# Patient Record
Sex: Female | Born: 1980 | Race: White | Hispanic: No | Marital: Married | Smoking: Former smoker
Health system: Southern US, Community
[De-identification: ages and names within clinical notes are randomized; demographics above are authoritative.]

## PROBLEM LIST (undated history)

## (undated) DIAGNOSIS — J45909 Unspecified asthma, uncomplicated: Secondary | ICD-10-CM

## (undated) DIAGNOSIS — I499 Cardiac arrhythmia, unspecified: Secondary | ICD-10-CM

## (undated) DIAGNOSIS — F419 Anxiety disorder, unspecified: Secondary | ICD-10-CM

## (undated) HISTORY — PX: OTHER SURGICAL HISTORY: SHX169

## (undated) HISTORY — PX: TONSILLECTOMY AND ADENOIDECTOMY: SHX28

---

## 2004-09-10 ENCOUNTER — Ambulatory Visit: Payer: Self-pay

## 2007-04-10 ENCOUNTER — Ambulatory Visit: Payer: Self-pay | Admitting: General Surgery

## 2007-04-17 ENCOUNTER — Ambulatory Visit: Payer: Self-pay | Admitting: General Surgery

## 2009-09-16 ENCOUNTER — Emergency Department: Payer: Self-pay | Admitting: Emergency Medicine

## 2009-11-09 ENCOUNTER — Ambulatory Visit: Payer: Self-pay | Admitting: Orthopedic Surgery

## 2009-11-14 ENCOUNTER — Ambulatory Visit: Payer: Self-pay | Admitting: Orthopedic Surgery

## 2012-07-21 ENCOUNTER — Observation Stay: Payer: Self-pay | Admitting: Obstetrics and Gynecology

## 2012-08-26 ENCOUNTER — Observation Stay: Payer: Self-pay | Admitting: Obstetrics and Gynecology

## 2012-08-26 LAB — URINALYSIS, COMPLETE
Bilirubin,UR: NEGATIVE
Blood: NEGATIVE
Nitrite: NEGATIVE
Ph: 7 (ref 4.5–8.0)
Protein: 30
RBC,UR: 9 /HPF (ref 0–5)
Squamous Epithelial: 64
WBC UR: 12 /HPF (ref 0–5)

## 2012-10-16 ENCOUNTER — Inpatient Hospital Stay: Payer: Self-pay

## 2012-10-16 LAB — PIH PROFILE
Anion Gap: 12 (ref 7–16)
BUN: 9 mg/dL (ref 7–18)
Calcium, Total: 8.8 mg/dL (ref 8.5–10.1)
Creatinine: 0.68 mg/dL (ref 0.60–1.30)
EGFR (Non-African Amer.): >60
HCT: 39.7 % (ref 35.0–47.0)
HGB: 13 g/dL (ref 12.0–16.0)
MCHC: 32.7 g/dL (ref 32.0–36.0)
MCV: 87 fL (ref 80–100)
Osmolality: 282 (ref 275–301)
Platelet: 129 10*3/uL — ABNORMAL LOW (ref 150–440)
Potassium: 3.6 mmol/L (ref 3.5–5.1)
RBC: 4.58 10*6/uL (ref 3.80–5.20)
RDW: 15.7 % — ABNORMAL HIGH (ref 11.5–14.5)
SGOT(AST): 17 U/L (ref 15–37)
Sodium: 141 mmol/L (ref 136–145)

## 2012-10-16 LAB — PROTEIN / CREATININE RATIO, URINE
Creatinine, Urine: 195.4 mg/dL — ABNORMAL HIGH (ref 30.0–125.0)
Protein/Creat. Ratio: 189 mg/gCREAT (ref 0–200)

## 2012-10-17 LAB — PLATELET COUNT: Platelet: 128 10*3/uL — ABNORMAL LOW (ref 150–440)

## 2012-10-18 LAB — PIH PROFILE
BUN: 5 mg/dL — ABNORMAL LOW (ref 7–18)
Calcium, Total: 7 mg/dL — CL (ref 8.5–10.1)
Chloride: 106 mmol/L (ref 98–107)
Creatinine: 0.55 mg/dL — ABNORMAL LOW (ref 0.60–1.30)
EGFR (Non-African Amer.): >60
Glucose: 109 mg/dL — ABNORMAL HIGH (ref 65–99)
MCHC: 33.3 g/dL (ref 32.0–36.0)
Potassium: 4.1 mmol/L (ref 3.5–5.1)
RBC: 4.01 10*6/uL (ref 3.80–5.20)
RDW: 15.8 % — ABNORMAL HIGH (ref 11.5–14.5)
SGOT(AST): 17 U/L (ref 15–37)
Sodium: 136 mmol/L (ref 136–145)

## 2012-10-18 LAB — MAGNESIUM: Magnesium: 5 mg/dL — ABNORMAL HIGH

## 2012-10-19 LAB — PIH PROFILE
BUN: 6 mg/dL — ABNORMAL LOW (ref 7–18)
Calcium, Total: 8.1 mg/dL — ABNORMAL LOW (ref 8.5–10.1)
Chloride: 107 mmol/L (ref 98–107)
Co2: 25 mmol/L (ref 21–32)
Glucose: 76 mg/dL (ref 65–99)
HCT: 30.2 % — ABNORMAL LOW (ref 35.0–47.0)
HGB: 10.1 g/dL — ABNORMAL LOW (ref 12.0–16.0)
MCHC: 33.4 g/dL (ref 32.0–36.0)
MCV: 89 fL (ref 80–100)
Platelet: 113 10*3/uL — ABNORMAL LOW (ref 150–440)
Potassium: 4.1 mmol/L (ref 3.5–5.1)
RDW: 16.3 % — ABNORMAL HIGH (ref 11.5–14.5)
SGOT(AST): 20 U/L (ref 15–37)
Uric Acid: 6.1 mg/dL — ABNORMAL HIGH (ref 2.6–6.0)

## 2013-03-03 ENCOUNTER — Ambulatory Visit: Payer: Self-pay | Admitting: Unknown Physician Specialty

## 2013-03-05 LAB — PATHOLOGY REPORT

## 2013-07-06 ENCOUNTER — Ambulatory Visit: Payer: Self-pay | Admitting: Podiatry

## 2014-06-06 ENCOUNTER — Ambulatory Visit: Payer: Self-pay | Admitting: Family Medicine

## 2015-01-06 NOTE — Op Note (Signed)
PATIENT NAME:  Theresa Hensley, TOELLE MR#:  [id number] DATE OF BIRTH:  [birth year]  DATE OF PROCEDURE:  10/18/2012  PREOPERATIVE DIAGNOSIS: Active phase arrest.   POSTOPERATIVE DIAGNOSIS: Active phase arrest.   PROCEDURE: Low transverse cesarean section.   ANESTHESIA: Surgical dosing epidural.   SURGEON: Laverta Baltimore, MD  FIRST ASSISTANT: (Dictation Anomaly)<<MISSING TEXT>>   INDICATION: The patient is a 34 year old gravida 1, para 0 patient at 39 weeks. The patient admitted for preeclampsia and underwent induction. The patient progressed to 6 cm and did not progress past despite 3 hours of IV Pitocin.   DESCRIPTION OF PROCEDURE: After adequate surgical dosing of epidural, the patient was placed in the dorsal supine position. The abdomen was prepped and draped in normal sterile fashion. She did receive 2 grams IV Ancef prior to commencement of the case. A Pfannenstiel incision was made two fingerbreadths above the symphysis pubis. Sharp dissection was used to identify the fascia. The fascia was opened in the midline and opened in a transverse fashion. The superior aspect of the fascia was grasped with Kocher clamps, and the recti muscles dissected free. The inferior aspect of the fascia was grasped with Kocher clamps and the pyramidalis muscle was dissected free. The peritoneal cavity was entered sharply. The vesicouterine peritoneal fold was identified. Bladder flap was created and the bladder was reflected inferiorly. Low transverse uterine incision was made. Upon entry into the endometrial cavity, clear fluid resulted. A wedged fetal head was brought to the incision. Vacuum was applied to the fetal occiput. One gentle pull allowed for delivery of the head. The vacuum was removed. A loose nuchal cord was reduced and delivery of the shoulders and body accomplished without difficulty. Cord was doubly clamped, and a vigorous female was passed to nursery staff, who assigned Apgar scores of 9 and 9.  Placenta was manually delivered. The uterus was exteriorized and wiped clean with laparotomy tape. The uterine incision was closed with 1 chromic suture in a running locking fashion with good approximation of edges. Good hemostasis was noted. Three additional figure-of-eight sutures were required for hemostasis. Fallopian tubes and ovaries appeared normal. There were two notable adhesions from the bowel to the posterior aspect of the uterus. These were taken down sharply without difficulty. The posterior cul-de-sac suctioned and irrigated, and uterus was placed back into the abdominal cavity. The paracolic gutters were wiped clean with laparotomy tape. The uterine incision again appeared hemostatic. Interceed was placed over the uterine incision in a T-shaped fashion. The superior aspect of the fascia was regrasped with Kocher clamps and the On-Q pump catheters were advanced infraumbilically into the subfascial area. The fascia was then closed above this in a running nonlocking fashion with 0 Vicryl suture. The subcutaneous tissues were irrigated and bovied for hemostasis. The skin was reapproximated with staples. The On-Q pump catheters were secured at the skin level with Dermabond and Steri-Stripped and Tegaderm placed above. Each catheter was loaded with 5 mL of 0.5% Marcaine. There were no complications. Estimated blood loss 800 mL.  Intraoperative fluids 2000 mL and 100 mL of urine. The patient tolerated the procedure well and was taken to the recovery room in good condition.    ____________________________ Boykin Nearing, MD tjs:th D: 10/18/2012 00:30:43 ET T: 10/18/2012 20:55:00 ET JOB#: [id number]  cc: Boykin Nearing, MD, <Dictator>

## 2015-01-06 NOTE — Op Note (Signed)
PATIENT NAME:  Theresa Hensley, Theresa Hensley MR#:  [id number] DATE OF BIRTH:  [birth year]  DATE OF PROCEDURE:  10/17/2012  PREOPERATIVE DIAGNOSIS: Active phase arrest.   POSTOPERATIVE DIAGNOSIS: Active phase arrest.   PROCEDURE: Low transverse cesarean section.   ANESTHESIA: Surgical dosing epidural.   SURGEON: Laverta Baltimore, MD  FIRST ASSISTANT: Flossie Dibble, CNM   INDICATION: The patient is a 34 year old gravida 1, para 0 patient at 62 weeks. The patient admitted for preeclampsia and underwent induction. The patient progressed to 6 cm and did not progress past despite 3 hours of IV Pitocin.   DESCRIPTION OF PROCEDURE: After adequate surgical dosing of epidural, the patient was placed in the dorsal supine position. The abdomen was prepped and draped in normal sterile fashion. She did receive 2 grams IV Ancef prior to commencement of the case. A Pfannenstiel incision was made two fingerbreadths above the symphysis pubis. Sharp dissection was used to identify the fascia. The fascia was opened in the midline and opened in a transverse fashion. The superior aspect of the fascia was grasped with Kocher clamps, and the recti muscles dissected free. The inferior aspect of the fascia was grasped with Kocher clamps and the pyramidalis muscle was dissected free. The peritoneal cavity was entered sharply. The vesicouterine peritoneal fold was identified. Bladder flap was created and the bladder was reflected inferiorly. Low transverse uterine incision was made. Upon entry into the endometrial cavity, clear fluid resulted. A wedged fetal head was brought to the incision. Vacuum was applied to the fetal occiput. One gentle pull allowed for delivery of the head. The vacuum was removed. A loose nuchal cord was reduced and delivery of the shoulders and body accomplished without difficulty. Cord was doubly clamped, and a vigorous female was passed to nursery staff, who assigned Apgar scores of 9 and 9. Placenta was  manually delivered. The uterus was exteriorized and wiped clean with laparotomy tape. The uterine incision was closed with 1 chromic suture in a running locking fashion with good approximation of edges. Good hemostasis was noted. Three additional figure-of-eight sutures were required for hemostasis. Fallopian tubes and ovaries appeared normal. There were two notable adhesions from the bowel to the posterior aspect of the uterus. These were taken down sharply without difficulty. The posterior cul-de-sac suctioned and irrigated, and uterus was placed back into the abdominal cavity. The paracolic gutters were wiped clean with laparotomy tape. The uterine incision again appeared hemostatic. Interceed was placed over the uterine incision in a T-shaped fashion. The superior aspect of the fascia was regrasped with Kocher clamps and the On-Q pump catheters were advanced infraumbilically into the subfascial area. The fascia was then closed above this in a running nonlocking fashion with 0 Vicryl suture. The subcutaneous tissues were irrigated and bovied for hemostasis. The skin was reapproximated with staples. The On-Q pump catheters were secured at the skin level with Dermabond and Steri-Stripped and Tegaderm placed above. Each catheter was loaded with 5 mL of 0.5% Marcaine. There were no complications. Estimated blood loss 800 mL.  Intraoperative fluids 2000 mL and 100 mL of urine. The patient tolerated the procedure well and was taken to the recovery room in good condition.    ____________________________ Boykin Nearing, MD tjs:th D: 10/18/2012 00:30:00 ET T: 10/18/2012 20:55:00 ET JOB#: [id number]  cc: Boykin Nearing, MD, <Dictator> Boykin Nearing MD ELECTRONICALLY SIGNED 10/26/2012 9:02

## 2015-01-24 NOTE — H&P (Signed)
L&D Evaluation:  History:   HPI 34 y/o G1 @ 38/5wks [facility] 10/24/12 sent from [facility] office due to elevated blood pressures 142/95 152/92. Denies Pre e/sx no headache, visual disturbances, NV RUQ or epigastric pain. Occ irregular mild uc, denies leakng fluid bloody show baby is active.GBS negative    Presents with above    Patient's Medical History No Chronic Illness    Patient's Surgical History none    Medications Pre Natal Vitamins    Allergies ASA    Social History none    Family History Non-Contributory   ROS:   ROS All systems were reviewed.  HEENT, CNS, GI, GU, Respiratory, CV, Renal and Musculoskeletal systems were found to be normal.   Exam:   Vital Signs BP >140/90  142/90   152/87   127/68    Urine Protein to lab    General no apparent distress    Mental Status clear    Chest clear    Heart normal sinus rhythm    Abdomen gravid, non-tender    Estimated Fetal Weight Average for gestational age    Fetal Position vtx    Fundal Height term    Back no CVAT    Edema 1+  pedal hands    Reflexes 2+    Clonus negative    Pelvic no external lesions, 2cm cx thick posterior BOWI sm show    Mebranes Intact    FHT normal rate with no decels, baseline 140's 150's avg variability withaccels    FHT Description 136    Ucx irregular    Skin dry    Lymph no lymphadenopathy   Impression:   Impression IOL @ term Pre/e   Plan:   Plan monitor contractions and for cervical change, monitor BP, PIH panel    Comments Admitted, explained preeclampsia, what to expect with cervidil ripening, IOL and magnesium sulfate administration.Labs: elevated uric acid 6.3, low platlets 129 protein creatinine ratio elevated 189. Husband at bedside supportive. Plan epidural with labor progress. TJS consulted per plan of care.   Electronic Signatures: Rosie Fate (CNM)  (Signed 31-Jan-14 18:24)  Authored: L&D Evaluation   Last Updated: 31-Jan-14 18:24 by Rosie Fate  (CNM)

## 2015-06-28 ENCOUNTER — Other Ambulatory Visit: Payer: Self-pay | Admitting: Obstetrics and Gynecology

## 2015-06-28 DIAGNOSIS — Z1231 Encounter for screening mammogram for malignant neoplasm of breast: Secondary | ICD-10-CM

## 2015-07-07 ENCOUNTER — Ambulatory Visit
Admission: RE | Admit: 2015-07-07 | Discharge: 2015-07-07 | Disposition: A | Discharge status: Home / Self Care | Payer: BLUE CROSS/BLUE SHIELD | Source: Ambulatory Visit | Attending: Obstetrics and Gynecology | Admitting: Obstetrics and Gynecology

## 2015-07-07 DIAGNOSIS — Z1231 Encounter for screening mammogram for malignant neoplasm of breast: Secondary | ICD-10-CM | POA: Insufficient documentation

## 2015-08-08 NOTE — H&P (Signed)
Theresa Hensley is a 34 y.o. female here L/S BTL and Novasure ablation . H/o menorrhagia , worsening . Bleeds for 5 days + clots . Already on OCPS  EMBX last visit wnl .pt desires permanent sterilization  Saline infusion today: wnl , endometrial stripe 5.5 mm . No pathology seen with infusion of sterile water  Past Medical History:  has a past medical history of Allergic state; Asthma without status asthmaticus; Chronic headaches; History of chickenpox; History of hemorrhoids; Mastocytosis; Obesity; Raynaud's phenomenon; and Raynaud's phenomenon.  Past Surgical History:  has a past surgical history that includes ORIF proximal ulna fracture; Tonsillectomy; Hemorrhoidectomy; Colonoscopy (June-2014); Cesarean section (2014); and Ovarian cyst removed. Family History: family history includes Asthma in her brother, father, mother, and sister; Colon cancer in her maternal grandmother and paternal grandmother; Colon polyps in her father; Diabetes mellitus in her father, maternal grandmother, paternal grandmother, and sister; Hypertension in her father; Other in her maternal grandfather. Social History:  reports that she quit smoking about 4 years ago. She has never used smokeless tobacco. She reports that she drinks alcohol. She reports that she does not use illicit drugs. OB/GYN History:  OB History    Gravida Para Term Preterm AB TAB SAB Ectopic Multiple Living   1 1 1       1       Allergies: is allergic to aspirin. Medications:  Current Outpatient Prescriptions:  . albuterol (PROVENTIL HFA) 90 mcg/actuation inhaler, Inhale 2 inhalations into the lungs every 4 (four) hours as needed for Wheezing or Shortness of Breath., Disp: 1 Inhaler, Rfl: 0 . ALPRAZolam (XANAX) 0.25 MG tablet, Take 1 tablet (0.25 mg total) by mouth once daily as needed for Sleep., Disp: 30 tablet, Rfl: 5 . cetirizine (ZYRTEC) 10 mg capsule, Take 10 mg by mouth once daily., Disp: , Rfl:  . fluticasone  (FLONASE) 50 mcg/actuation nasal spray, Place 1 spray into both nostrils 2 (two) times daily., Disp: 16 g, Rfl: 0 . montelukast (SINGULAIR) 10 mg tablet, TAKE ONE TABLET EACH NIGHT, Disp: 30 tablet, Rfl: 5 . multivitamin tablet, Take 1 tablet by mouth once daily., Disp: , Rfl:  . TRI-PREVIFEM, 28, 0.18/0.215/0.25 mg-35 mcg (28) tablet, USE AS PER PACKAGE INSTRUCTIONS TAKE 1 TABLET EVERY DAY, Disp: 1 Package, Rfl: 11 . valACYclovir (VALTREX) 500 MG tablet, TAKE ONE TABLET TWICE A DAY, Disp: 60 tablet, Rfl: 5  Review of Systems: General:   No fatigue or weight loss Eyes:   No vision changes Ears:   No hearing difficulty Respiratory:   No cough or shortness of breath Pulmonary:   No asthma or shortness of breath Cardiovascular:  No chest pain, palpitations, dyspnea on exertion Gastrointestinal:  No abdominal bloating, chronic diarrhea, constipations, masses, pain or hematochezia Genitourinary:  No hematuria, dysuria, abnormal vaginal discharge, pelvic pain, Menometrorrhagia Lymphatic:  No swollen lymph nodes Musculoskeletal: No muscle weakness Neurologic:  No extremity weakness, syncope, seizure disorder Psychiatric:  No history of depression, delusions or suicidal/homicidal ideation   Exam:      Vitals:   08/07/15 1415  BP: 130/89  Pulse: 71    Body mass index is 27.81 kg/(m^2).  WDWN white/ female in NAD  Lungs: CTA  CV : RRR without murmur  Neck: no thyromegaly Abdomen: soft , no mass, normal active bowel sounds, non-tender, no rebound tenderness Pelvic: tanner stage 5 ,  External genitalia: vulva /labia no lesions Urethra: no prolapse Vagina: normal physiologic d/c Cervix: no lesions, no cervical motion tenderness  Uterus: normal size  shape and contour, non-tender Adnexa: no mass, non-tender    Impression:   The encounter diagnosis was Menorrhagia with regular cycle. Elective sterilization     Plan:   I have spoken with the  patient regarding treatment options including expectant management, hormonal options, or surgical intervention. After a full discussion the pt elects to proceed with L/S BTL and a Novasure ablation  Pro + cons discussed  Pt is aware of the failure rate from BTL .  All questions answered

## 2015-08-14 ENCOUNTER — Encounter
Admission: RE | Admit: 2015-08-14 | Discharge: 2015-08-14 | Disposition: A | Discharge status: Home / Self Care | Payer: BLUE CROSS/BLUE SHIELD | Source: Ambulatory Visit | Attending: Obstetrics and Gynecology | Admitting: Obstetrics and Gynecology

## 2015-08-14 DIAGNOSIS — J45909 Unspecified asthma, uncomplicated: Secondary | ICD-10-CM | POA: Diagnosis not present

## 2015-08-14 DIAGNOSIS — Z7951 Long term (current) use of inhaled steroids: Secondary | ICD-10-CM | POA: Diagnosis not present

## 2015-08-14 DIAGNOSIS — Z302 Encounter for sterilization: Secondary | ICD-10-CM | POA: Diagnosis present

## 2015-08-14 DIAGNOSIS — Z79899 Other long term (current) drug therapy: Secondary | ICD-10-CM | POA: Diagnosis not present

## 2015-08-14 DIAGNOSIS — Z87891 Personal history of nicotine dependence: Secondary | ICD-10-CM | POA: Diagnosis not present

## 2015-08-14 DIAGNOSIS — Z886 Allergy status to analgesic agent status: Secondary | ICD-10-CM | POA: Diagnosis not present

## 2015-08-14 DIAGNOSIS — N920 Excessive and frequent menstruation with regular cycle: Secondary | ICD-10-CM | POA: Diagnosis present

## 2015-08-14 DIAGNOSIS — E669 Obesity, unspecified: Secondary | ICD-10-CM | POA: Diagnosis not present

## 2015-08-14 HISTORY — DX: Anxiety disorder, unspecified: F41.9

## 2015-08-14 HISTORY — DX: Cardiac arrhythmia, unspecified: I49.9

## 2015-08-14 HISTORY — DX: Unspecified asthma, uncomplicated: J45.909

## 2015-08-14 LAB — CBC
HEMATOCRIT: 41.9 % (ref 35.0–47.0)
Hemoglobin: 13.6 g/dL (ref 12.0–16.0)
MCH: 28.6 pg (ref 26.0–34.0)
MCHC: 32.5 g/dL (ref 32.0–36.0)
MCV: 88 fL (ref 80.0–100.0)
PLATELETS: 199 10*3/uL (ref 150–440)
RBC: 4.76 MIL/uL (ref 3.80–5.20)
RDW: 14.1 % (ref 11.5–14.5)
WBC: 7.1 10*3/uL (ref 3.6–11.0)

## 2015-08-14 LAB — TYPE AND SCREEN
ABO/RH(D): O POS
ANTIBODY SCREEN: NEGATIVE

## 2015-08-14 LAB — BASIC METABOLIC PANEL
Anion gap: 5 (ref 5–15)
BUN: 13 mg/dL (ref 6–20)
CHLORIDE: 105 mmol/L (ref 101–111)
CO2: 28 mmol/L (ref 22–32)
CREATININE: 0.65 mg/dL (ref 0.44–1.00)
Calcium: 9.2 mg/dL (ref 8.9–10.3)
GFR calc Af Amer: >60 mL/min (ref >60)
GFR calc non Af Amer: >60 mL/min (ref >60)
GLUCOSE: 105 mg/dL — AB (ref 65–99)
POTASSIUM: 3.6 mmol/L (ref 3.5–5.1)
SODIUM: 138 mmol/L (ref 135–145)

## 2015-08-14 LAB — PREGNANCY, URINE: Preg Test, Ur: NEGATIVE

## 2015-08-14 LAB — ABO/RH: ABO/RH(D): O POS

## 2015-08-14 NOTE — Patient Instructions (Addendum)
  Your procedure is scheduled on: 08/15/15 Tues at 8:15 am Report to Day Surgery.   Remember: Instructions that are not followed completely may result in serious medical risk, up to and including death, or upon the discretion of your surgeon and anesthesiologist your surgery may need to be rescheduled.    __x__ 1. Do not eat food or drink liquids after midnight. No gum chewing or hard candies.     __x__ 2. No Alcohol for 24 hours before or after surgery.   ____ 3. Bring all medications with you on the day of surgery if instructed.    ___x_ 4. Notify your doctor if there is any change in your medical condition     (cold, fever, infections).     Do not wear jewelry, make-up, hairpins, clips or nail polish.  Do not wear lotions, powders, or perfumes. You may wear deodorant.  Do not shave 48 hours prior to surgery. Men may shave face and neck.  Do not bring valuables to the hospital.    [facility] is not responsible for any belongings or valuables.               Contacts, dentures or bridgework may not be worn into surgery.  Leave your suitcase in the car. After surgery it may be brought to your room.  For patients admitted to the hospital, discharge time is determined by your                treatment team.   Patients discharged the day of surgery will not be allowed to drive home.   Please read over the following fact sheets that you were given:      _x___ Take these medicines the morning of surgery with A SIP OF WATER:    1. ALPRAZolam (XANAX) 0.25 MG tablet  2.   3.   4.  5.  6.  ____ Fleet Enema (as directed)   _x___ Use CHG Soap as directed  ____ Use inhalers on the day of surgery  ____ Stop metformin 2 days prior to surgery    ____ Take 1/2 of usual insulin dose the night before surgery and none on the morning of surgery.   ____ Stop Coumadin/Plavix/aspirin on   ____ Stop Anti-inflammatories on    ____ Stop supplements until after surgery.    ____ Bring C-Pap  to the hospital.

## 2015-08-15 ENCOUNTER — Ambulatory Visit: Payer: BLUE CROSS/BLUE SHIELD | Admitting: Certified Registered"

## 2015-08-15 ENCOUNTER — Encounter: Payer: Self-pay | Admitting: *Deleted

## 2015-08-15 ENCOUNTER — Encounter
Admission: RE | Disposition: A | Discharge status: Home / Self Care | Payer: Self-pay | Source: Ambulatory Visit | Attending: Obstetrics and Gynecology

## 2015-08-15 ENCOUNTER — Ambulatory Visit
Admission: RE | Admit: 2015-08-15 | Discharge: 2015-08-15 | Disposition: A | Discharge status: Home / Self Care | Payer: BLUE CROSS/BLUE SHIELD | Source: Ambulatory Visit | Attending: Obstetrics and Gynecology | Admitting: Obstetrics and Gynecology

## 2015-08-15 DIAGNOSIS — N920 Excessive and frequent menstruation with regular cycle: Secondary | ICD-10-CM | POA: Diagnosis not present

## 2015-08-15 DIAGNOSIS — Z7951 Long term (current) use of inhaled steroids: Secondary | ICD-10-CM | POA: Insufficient documentation

## 2015-08-15 DIAGNOSIS — J45909 Unspecified asthma, uncomplicated: Secondary | ICD-10-CM | POA: Insufficient documentation

## 2015-08-15 DIAGNOSIS — E669 Obesity, unspecified: Secondary | ICD-10-CM | POA: Insufficient documentation

## 2015-08-15 DIAGNOSIS — Z886 Allergy status to analgesic agent status: Secondary | ICD-10-CM | POA: Insufficient documentation

## 2015-08-15 DIAGNOSIS — Z79899 Other long term (current) drug therapy: Secondary | ICD-10-CM | POA: Insufficient documentation

## 2015-08-15 DIAGNOSIS — Z302 Encounter for sterilization: Secondary | ICD-10-CM | POA: Insufficient documentation

## 2015-08-15 DIAGNOSIS — Z87891 Personal history of nicotine dependence: Secondary | ICD-10-CM | POA: Insufficient documentation

## 2015-08-15 HISTORY — PX: LAPAROSCOPIC LYSIS OF ADHESIONS: SHX5905

## 2015-08-15 HISTORY — PX: HYSTEROSCOPY WITH NOVASURE: SHX5574

## 2015-08-15 HISTORY — PX: LAPAROSCOPIC TUBAL LIGATION: SHX1937

## 2015-08-15 SURGERY — LIGATION, FALLOPIAN TUBE, LAPAROSCOPIC
Anesthesia: General

## 2015-08-15 MED ORDER — BUPIVACAINE HCL (PF) 0.5 % IJ SOLN
INTRAMUSCULAR | Status: AC
  Filled 2015-08-15: qty 30

## 2015-08-15 MED ORDER — FENTANYL CITRATE (PF) 100 MCG/2ML IJ SOLN
INTRAMUSCULAR | Status: DC | PRN
  Administered 2015-08-15: 100 ug via INTRAVENOUS
  Administered 2015-08-15 (×2): 50 ug via INTRAVENOUS

## 2015-08-15 MED ORDER — LACTATED RINGERS IV SOLN
INTRAVENOUS | Status: DC
  Administered 2015-08-15: 10:00:00 via INTRAVENOUS

## 2015-08-15 MED ORDER — ROCURONIUM BROMIDE 100 MG/10ML IV SOLN
INTRAVENOUS | Status: DC | PRN
  Administered 2015-08-15: 10 mg via INTRAVENOUS
  Administered 2015-08-15: 40 mg via INTRAVENOUS

## 2015-08-15 MED ORDER — DEXAMETHASONE SODIUM PHOSPHATE 4 MG/ML IJ SOLN
INTRAMUSCULAR | Status: DC | PRN
  Administered 2015-08-15: 5 mg via INTRAVENOUS

## 2015-08-15 MED ORDER — KETOROLAC TROMETHAMINE 30 MG/ML IJ SOLN
INTRAMUSCULAR | Status: DC | PRN
  Administered 2015-08-15: 30 mg via INTRAVENOUS

## 2015-08-15 MED ORDER — MIDAZOLAM HCL 2 MG/2ML IJ SOLN
INTRAMUSCULAR | Status: DC | PRN
  Administered 2015-08-15: 2 mg via INTRAVENOUS

## 2015-08-15 MED ORDER — CEFOXITIN SODIUM-DEXTROSE 2-2.2 GM-% IV SOLR (PREMIX)
INTRAVENOUS | Status: AC
Start: 2015-08-15 — End: 2015-08-15
  Administered 2015-08-15: 2000 mg
  Filled 2015-08-15: qty 50

## 2015-08-15 MED ORDER — FLEET ENEMA 7-19 GM/118ML RE ENEM: 1.0000 | ENEMA | Freq: Once | RECTAL | Status: DC

## 2015-08-15 MED ORDER — ONDANSETRON HCL 4 MG/2ML IJ SOLN
INTRAMUSCULAR | Status: DC | PRN
  Administered 2015-08-15: 4 mg via INTRAVENOUS

## 2015-08-15 MED ORDER — FENTANYL CITRATE (PF) 100 MCG/2ML IJ SOLN
INTRAMUSCULAR | Status: AC
  Administered 2015-08-15: 25 ug via INTRAVENOUS
  Filled 2015-08-15: qty 2

## 2015-08-15 MED ORDER — ONDANSETRON HCL 4 MG/2ML IJ SOLN: 4.0000 mg | Freq: Once | INTRAMUSCULAR | Status: DC | PRN

## 2015-08-15 MED ORDER — LACTATED RINGERS IV SOLN
INTRAVENOUS | Status: DC
  Administered 2015-08-15: 09:00:00 via INTRAVENOUS

## 2015-08-15 MED ORDER — FAMOTIDINE 20 MG PO TABS
20.0000 mg | ORAL_TABLET | Freq: Once | ORAL | Status: AC
  Administered 2015-08-15: 20 mg via ORAL

## 2015-08-15 MED ORDER — METHYLENE BLUE 1 % INJ SOLN
INTRAMUSCULAR | Status: DC | PRN
  Administered 2015-08-15: 250 mL via INTRAVESICAL

## 2015-08-15 MED ORDER — CEFOXITIN SODIUM-DEXTROSE 2-2.2 GM-% IV SOLR (PREMIX): 2.0000 g | INTRAVENOUS | Status: DC

## 2015-08-15 MED ORDER — FENTANYL CITRATE (PF) 100 MCG/2ML IJ SOLN
25.0000 ug | INTRAMUSCULAR | Status: DC | PRN
  Administered 2015-08-15 (×4): 25 ug via INTRAVENOUS

## 2015-08-15 MED ORDER — BUPIVACAINE HCL 0.5 % IJ SOLN
INTRAMUSCULAR | Status: DC | PRN
  Administered 2015-08-15: 7 mL

## 2015-08-15 MED ORDER — PROPOFOL 10 MG/ML IV BOLUS
INTRAVENOUS | Status: DC | PRN
  Administered 2015-08-15: 200 mg via INTRAVENOUS

## 2015-08-15 MED ORDER — SUGAMMADEX SODIUM 200 MG/2ML IV SOLN
INTRAVENOUS | Status: DC | PRN
  Administered 2015-08-15: 150 mg via INTRAVENOUS

## 2015-08-15 MED ORDER — LIDOCAINE HCL (CARDIAC) 20 MG/ML IV SOLN
INTRAVENOUS | Status: DC | PRN
  Administered 2015-08-15: 50 mg via INTRAVENOUS

## 2015-08-15 MED ORDER — METHYLENE BLUE 1 % INJ SOLN
INTRAMUSCULAR | Status: AC
  Filled 2015-08-15: qty 10

## 2015-08-15 MED ORDER — FAMOTIDINE 20 MG PO TABS
ORAL_TABLET | ORAL | Status: AC
  Filled 2015-08-15: qty 1

## 2015-08-15 SURGICAL SUPPLY — 34 items
BLADE SURG SZ11 CARB STEEL (BLADE) ×5 IMPLANT
CANISTER SUC SOCK COL 7IN (MISCELLANEOUS) IMPLANT
CANISTER SUCT 3000ML (MISCELLANEOUS) ×5 IMPLANT
CATH ROBINSON RED A/P 16FR (CATHETERS) ×5 IMPLANT
CLOSURE WOUND 1/2 X4 (GAUZE/BANDAGES/DRESSINGS) ×1
CLOSURE WOUND 1/4X4 (GAUZE/BANDAGES/DRESSINGS) ×1
GLOVE BIO SURGEON STRL SZ8 (GLOVE) ×5 IMPLANT
GOWN STRL REUS W/ TWL LRG LVL3 (GOWN DISPOSABLE) ×3 IMPLANT
GOWN STRL REUS W/ TWL XL LVL3 (GOWN DISPOSABLE) ×3 IMPLANT
GOWN STRL REUS W/TWL LRG LVL3 (GOWN DISPOSABLE) ×2
GOWN STRL REUS W/TWL XL LVL3 (GOWN DISPOSABLE) ×2
KIT DISPOSABLE FALLOPE RING (Ring) IMPLANT
KIT RM TURNOVER CYSTO AR (KITS) ×5 IMPLANT
LABEL OR SOLS (LABEL) ×5 IMPLANT
MYOSURE LITE POLYP REMOVAL (MISCELLANEOUS) IMPLANT
NS IRRIG 500ML POUR BTL (IV SOLUTION) ×5 IMPLANT
PACK DNC HYST (MISCELLANEOUS) IMPLANT
PACK GYN LAPAROSCOPIC (MISCELLANEOUS) ×5 IMPLANT
PAD OB MATERNITY 4.3X12.25 (PERSONAL CARE ITEMS) ×5 IMPLANT
PAD PREP 24X41 OB/GYN DISP (PERSONAL CARE ITEMS) ×5 IMPLANT
SOL .9 NS 3000ML IRR  AL (IV SOLUTION) ×2
SOL .9 NS 3000ML IRR UROMATIC (IV SOLUTION) ×3 IMPLANT
STRIP CLOSURE SKIN 1/2X4 (GAUZE/BANDAGES/DRESSINGS) ×4 IMPLANT
STRIP CLOSURE SKIN 1/4X4 (GAUZE/BANDAGES/DRESSINGS) ×4 IMPLANT
SUT VIC AB 2-0 UR6 27 (SUTURE) ×5 IMPLANT
SUT VIC AB 4-0 SH 27 (SUTURE) ×2
SUT VIC AB 4-0 SH 27XANBCTRL (SUTURE) ×3 IMPLANT
SWABSTK COMLB BENZOIN TINCTURE (MISCELLANEOUS) ×5 IMPLANT
TOWEL OR 17X26 4PK STRL BLUE (TOWEL DISPOSABLE) ×5 IMPLANT
TROCAR ENDO BLADELESS 11MM (ENDOMECHANICALS) ×5 IMPLANT
TUBING CONNECTING 10 (TUBING) ×4 IMPLANT
TUBING CONNECTING 10' (TUBING) ×1
TUBING HYSTEROSCOPY DOLPHIN (MISCELLANEOUS) ×5 IMPLANT
TUBING INSUFFLATOR HI FLOW (MISCELLANEOUS) ×5 IMPLANT

## 2015-08-15 NOTE — Brief Op Note (Signed)
08/15/2015  11:49 AM  PATIENT:  Theresa Hensley  34 y.o. female  PRE-OPERATIVE DIAGNOSIS:  elective sterilization; menorrhagia  POST-OPERATIVE DIAGNOSIS:  elective sterilization; menorrhagia,  denseuterine adhesions  PROCEDURE:  Procedure(s): LAPAROSCOPIC TUBAL LIGATION (Bilateral) HYSTEROSCOPY WITH NOVASURE LAPAROSCOPIC LYSIS OF ADHESIONS  BTL cautery  Retrograde filling of bladder   SURGEON:  Surgeon(s) and Role:    Boykin Nearing, MD - Primary  PHYSICIAN ASSISTANT:   ASSISTANTS: none   ANESTHESIA:   general  EBL:  Total I/O In: -  Out: 410 [Urine:400; Blood:10]  IOF 700 cc  BLOOD ADMINISTERED:none  DRAINS: none   LOCAL MEDICATIONS USED:  MARCAINE     SPECIMEN:  No Specimen  DISPOSITION OF SPECIMEN:  N/A  COUNTS:  YES  TOURNIQUET:  * No tourniquets in log *  DICTATION: .Other Dictation: Dictation Number verbal  PLAN OF CARE: Discharge to home after PACU  PATIENT DISPOSITION:  PACU - hemodynamically stable.   Delay start of Pharmacological VTE agent (>24hrs) due to surgical blood loss or risk of bleeding: not applicable

## 2015-08-15 NOTE — Discharge Instructions (Signed)
AMBULATORY SURGERY  DISCHARGE INSTRUCTIONS   1) The drugs that you were given will stay in your system until tomorrow so for the next 24 hours you should not:  A) Drive an automobile B) Make any legal decisions C) Drink any alcoholic beverage   2) You may resume regular meals tomorrow.  Today it is better to start with liquids and gradually work up to solid foods.  You may eat anything you prefer, but it is better to start with liquids, then soup and crackers, and gradually work up to solid foods.   3) Please notify your doctor immediately if you have any unusual bleeding, trouble breathing, redness and pain at the surgery site, drainage, fever, or pain not relieved by medication. 4)   5) Your post-operative visit with Dr.                                     is: Date:                        Time:    Please call to schedule your post-operative visit.  6) Additional Instructions: Laparoscopic Tubal Ligation, Care After Refer to this sheet in the next few weeks. These instructions provide you with information about caring for yourself after your procedure. Your health care provider may also give you more specific instructions. Your treatment has been planned according to current medical practices, but problems sometimes occur. Call your health care provider if you have any problems or questions after your procedure. WHAT TO EXPECT AFTER THE PROCEDURE After your procedure, it is common to have: Sore throat. Soreness at the incision site. Mild cramping. Tiredness. Mild nausea or vomiting. Shoulder pain. HOME CARE INSTRUCTIONS Rest for the remainder of the day. Take medicines only as directed by your health care provider. These include over-the-counter medicines and prescription medicines. Do not take aspirin, which can cause bleeding. Over the next few days, gradually return to your normal activities and your normal diet. Avoid sexual intercourse for 2 weeks or as directed by your  health care provider. Do not use tampons, and do not douche. Do not drive or operate heavy machinery while taking pain medicine. Do not lift anything that is heavier than 5 lb (2.3 kg) for 2 weeks or as directed by your health care provider. Do not take baths. Take showers only. Ask your health care provider when you can start taking baths. Take your temperature twice each day and write it down. Try to have help for your household needs for the first 7-10 days. There are many different ways to close and cover an incision, including stitches (sutures), skin glue, and adhesive strips. Follow instructions from your health care provider about: Incision care. Bandage (dressing) changes and removal. Incision closure removal. Check your incision area every day for signs of infection. Watch for: Redness, swelling, or pain. Fluid, blood, or pus. Keep all follow-up visits as directed by your health care provider. SEEK MEDICAL CARE IF: You have redness, swelling, or increasing pain in your incision area. You have fluid, blood, or pus coming from your incision for longer than 1 day. You notice a bad smell coming from your incision or your dressing. The edges of your incision break open after the sutures have been removed. Your pain does not decrease after 2-3 days. You have a rash. You repeatedly become dizzy or light-headed. You have  a reaction to your medicine. Your pain medicine is not helping. You are constipated. SEEK IMMEDIATE MEDICAL CARE IF: You have a fever. You faint. You have increasing pain in your abdomen. You have severe pain in one or both of your shoulders. You have bleeding or drainage from your suture sites or your vagina after surgery. You have shortness of breath or have difficulty breathing. You have chest pain or leg pain. You have ongoing nausea, vomiting, or diarrhea.   This information is not intended to replace advice given to you by your health care provider. Make  sure you discuss any questions you have with your health care provider.   Document Released: 03/22/2005 Document Revised: 01/17/2015 Document Reviewed: 12/14/2011 Elsevier Interactive Patient Education [employer].

## 2015-08-15 NOTE — Anesthesia Procedure Notes (Signed)
Procedure Name: Intubation Performed by: Tiondra Fang Pre-anesthesia Checklist: Patient identified, Patient being monitored, Timeout performed, Emergency Drugs available and Suction available Patient Re-evaluated:Patient Re-evaluated prior to inductionOxygen Delivery Method: Circle system utilized Preoxygenation: Pre-oxygenation with 100% oxygen Intubation Type: IV induction Ventilation: Mask ventilation without difficulty Laryngoscope Size: Miller and 2 Grade View: Grade I Tube type: Oral Tube size: 7.0 mm Number of attempts: 1 Airway Equipment and Method: Stylet Placement Confirmation: ETT inserted through vocal cords under direct vision,  positive ETCO2 and breath sounds checked- equal and bilateral Secured at: 21 cm Tube secured with: Tape Dental Injury: Teeth and Oropharynx as per pre-operative assessment        

## 2015-08-15 NOTE — Transfer of Care (Signed)
Immediate Anesthesia Transfer of Care Note  Patient: Theresa Hensley  Procedure(s) Performed: Procedure(s): LAPAROSCOPIC TUBAL LIGATION via cautery (Bilateral) HYSTEROSCOPY WITH NOVASURE LAPAROSCOPIC LYSIS OF ADHESIONS  Patient Location: PACU  Anesthesia Type:General  Level of Consciousness: sedated  Airway & Oxygen Therapy: Patient Spontanous Breathing and Patient connected to face mask oxygen  Post-op Assessment: Report given to RN and Post -op Vital signs reviewed and stable  Post vital signs: Reviewed and stable  Last Vitals:  Filed Vitals:   08/15/15 0818 08/15/15 1207  BP: 119/79 135/81  Pulse: 71 74  Temp: 36.7 C 37.9 C  Resp: 16 15    Complications: No apparent anesthesia complications

## 2015-08-15 NOTE — Progress Notes (Signed)
Pt ready for surgery all questions answered . LAbs reviewed . Neg HCG . L/S BTL + Novasure ablation

## 2015-08-15 NOTE — Anesthesia Preprocedure Evaluation (Signed)
Anesthesia Evaluation  Patient identified by MRN, date of birth, ID band Patient awake    Reviewed: Allergy & Precautions, NPO status , Patient's Chart, lab work & pertinent test results, reviewed documented beta blocker date and time   Airway Mallampati: II  TM Distance: >3 FB     Dental  (+) Chipped   Pulmonary asthma , former smoker,           Cardiovascular      Neuro/Psych Anxiety    GI/Hepatic   Endo/Other    Renal/GU      Musculoskeletal   Abdominal   Peds  Hematology   Anesthesia Other Findings   Reproductive/Obstetrics                             Anesthesia Physical Anesthesia Plan  ASA: II  Anesthesia Plan: General   Post-op Pain Management:    Induction: Intravenous  Airway Management Planned: Oral ETT  Additional Equipment:   Intra-op Plan:   Post-operative Plan:   Informed Consent: I have reviewed the patients History and Physical, chart, labs and discussed the procedure including the risks, benefits and alternatives for the proposed anesthesia with the patient or authorized representative who has indicated his/her understanding and acceptance.     Plan Discussed with: CRNA  Anesthesia Plan Comments:         Anesthesia Quick Evaluation

## 2015-08-16 NOTE — Op Note (Signed)
Theresa Hensley, Theresa Hensley             ACCOUNT NO.:  [id number]  MEDICAL RECORD NO.:  [id number]  LOCATION:  [facility]                         FACILITY:  [facility]  PHYSICIAN:  Laverta Baltimore, MDDATE OF BIRTH:  07-27-1981  DATE OF PROCEDURE:  08/15/2015 DATE OF DISCHARGE:  08/15/2015                              OPERATIVE REPORT   PREOPERATIVE DIAGNOSIS: 1. Menorrhagia. 2. Elective permanent sterilization.  POSTOPERATIVE DIAGNOSIS: 1. Menorrhagia. 2. Elective permanent sterilization. 3. Significant uterine adhesions.  PROCEDURES: 1. NovaSure endometrial ablation. 2. Laparoscopic bilateral tubal cautery. 3. Extensive peritoneal adhesiolysis encompassing greater than 50% of     total operating time.  ANESTHESIA:  General endotracheal anesthesia.  SURGEON:  Laverta Baltimore, M.D.  FIRST ASSISTANT:  Benjaman Kindler, M.D.  INDICATION:  This is a 34 year old gravida 1, para 1 patient who has elected on permanent sterilization and has failed conservative treatment for menorrhagia for which she is to undergo NovaSure endometrial ablation.  DESCRIPTION OF PROCEDURE:  After adequate general endotracheal anesthesia, the patient was placed in a dorsal supine position.  Legs were placed in the [place].  The patient did receive 2 g IV cefoxitin prior to commencement.  A time-out was performed.  The patient's bladder was drained with a red Robinson catheter.  A weighted speculum was placed in the posterior vaginal vault.  Anterior cervix was grasped with a single-tooth tenaculum.  Uterus sounded to 9.5 cm.  The cervix was then dilated to #17 Hanks dilator, and the hysteroscope was advanced into the endometrial cavity.  Intraoperative pictures were taken.  Cervical length was measured at 4.5 cm.  Therefore, the cavity length was measured at 5 cm.  The hysteroscope was removed, and the NovaSure endometrial ablator was brought out to the operative field and advanced into the  endometrial cavity without difficulty.  The array was opened, cavity width of 3 cm with a calculated power setting of 83.  The cavity assessment test was performed and passed, and the ablation took place for 1 minute and 16 seconds.  The ablator was removed without difficulty, and a repeat hysteroscopy revealed normal charring effect as anticipated.  The single-tooth tenaculum was replaced with a Hulka tenaculum on the cervix and endocervical canal.  Gloves were changed. Legs were brought down, and a 12 mm infraumbilical incision was made after injecting with 0.5% Marcaine.  The laparoscope was advanced into the abdominal cavity without difficulty, and the abdomen was insufflated with carbon dioxide.  A second port site was placed in left lower quadrant, 3 cm medial to the left anterior iliac spine.  Initial impression revealed a significantly adhesed and scarred uterus to the anterior abdominal wall.  Pretty much the whole anterior uterus was densely adherent to the abdominal wall.  The Harmonic Scalpel was then brought out to the operative field, and then meticulous dissection of the uterine wall from the anterior abdominal wall ensued.  Again, greater than 50% of total operating time resulted from freeing up this uterus from the abdominal wall.  Once the uterus was adequately freed, the fallopian tubes were cauterized in three separate segments bilaterally.  Small oozing sites on the uterine serosa were noted and required Kleppinger cautery.  Given the dense  adherence to the anterior abdominal wall, the bladder was then retrograde filled with methylene blue, next with water, 300 mL were retrograde filled into the bladder with no defects and no spillage into the peritoneal cavity, and the bladder was then re-drained.  Good hemostasis was noted.  The patient's abdomen was irrigated and suctioned, and the patient's abdomen was deflated, and all instruments were removed.  The infraumbilical  incision was closed with a deep fascial suture of 2-0 Vicryl, and both skin incisions were closed with interrupted 4-0 Vicryl suture.  LiquiBand was placed, and Tegaderm was placed.  The Hulka tenaculum was removed.  Good hemostasis was noted.  There were no complications.  Estimated blood loss was 10 mL.  Intraoperative fluids were of 700 mL.  The patient was taken to the recovery room in good condition.          ______________________________ Laverta Baltimore, MD     TS/MEDQ  D:  08/15/2015  T:  08/16/2015  Job:  [id number]

## 2015-08-17 NOTE — Anesthesia Postprocedure Evaluation (Signed)
Anesthesia Post Note  Patient: Theresa Hensley  Procedure(s) Performed: Procedure(s) (LRB): LAPAROSCOPIC TUBAL LIGATION via cautery (Bilateral) HYSTEROSCOPY WITH NOVASURE LAPAROSCOPIC LYSIS OF ADHESIONS  Patient location during evaluation: PACU Anesthesia Type: General Level of consciousness: awake Pain management: pain level controlled Vital Signs Assessment: post-procedure vital signs reviewed and stable Respiratory status: spontaneous breathing Cardiovascular status: blood pressure returned to baseline    Last Vitals:  Filed Vitals:   08/15/15 1305 08/15/15 1346  BP: 125/71 120/69  Pulse: 73 71  Temp: 37 C   Resp: 16 16    Last Pain:  Filed Vitals:   08/15/15 1346  PainSc: [place]

## 2015-08-22 NOTE — Addendum Note (Signed)
Addendum  created 08/22/15 [id number] by Gunnar Bulla, MD   Modules edited: Clinical Notes   Clinical Notes:  File: [id number]

## 2015-08-22 NOTE — Anesthesia Postprocedure Evaluation (Signed)
Anesthesia Post Note  Patient: Theresa Hensley  Procedure(s) Performed: Procedure(s) (LRB): LAPAROSCOPIC TUBAL LIGATION via cautery (Bilateral) HYSTEROSCOPY WITH NOVASURE LAPAROSCOPIC LYSIS OF ADHESIONS  Patient location during evaluation: PACU Anesthesia Type: General Level of consciousness: awake Pain management: pain level controlled Vital Signs Assessment: post-procedure vital signs reviewed and stable Respiratory status: spontaneous breathing Cardiovascular status: blood pressure returned to baseline Anesthetic complications: no    Last Vitals:  Filed Vitals:   08/15/15 1305 08/15/15 1346  BP: 125/71 120/69  Pulse: 73 71  Temp: 37 C   Resp: 16 16    Last Pain:  Filed Vitals:   08/15/15 1346  PainSc: [place]

## 2016-06-25 ENCOUNTER — Other Ambulatory Visit: Payer: Self-pay | Admitting: Obstetrics and Gynecology

## 2016-06-25 DIAGNOSIS — N6320 Unspecified lump in the left breast, unspecified quadrant: Secondary | ICD-10-CM

## 2016-07-10 ENCOUNTER — Ambulatory Visit
Admission: RE | Admit: 2016-07-10 | Discharge: 2016-07-10 | Disposition: A | Discharge status: Home / Self Care | Payer: 59 | Source: Ambulatory Visit | Attending: Obstetrics and Gynecology | Admitting: Obstetrics and Gynecology

## 2016-07-10 DIAGNOSIS — N6320 Unspecified lump in the left breast, unspecified quadrant: Secondary | ICD-10-CM

## 2018-08-31 DIAGNOSIS — D4709 Other mast cell neoplasms of uncertain behavior: Secondary | ICD-10-CM | POA: Insufficient documentation

## 2018-08-31 DIAGNOSIS — N920 Excessive and frequent menstruation with regular cycle: Secondary | ICD-10-CM | POA: Insufficient documentation

## 2018-08-31 DIAGNOSIS — T7840XA Allergy, unspecified, initial encounter: Secondary | ICD-10-CM | POA: Insufficient documentation

## 2018-09-01 ENCOUNTER — Ambulatory Visit (INDEPENDENT_AMBULATORY_CARE_PROVIDER_SITE_OTHER): Payer: BLUE CROSS/BLUE SHIELD | Admitting: Podiatry

## 2018-09-01 ENCOUNTER — Encounter: Payer: Self-pay | Admitting: Podiatry

## 2018-09-01 DIAGNOSIS — L600 Ingrowing nail: Secondary | ICD-10-CM | POA: Diagnosis not present

## 2018-09-01 MED ORDER — GENTAMICIN SULFATE 0.1 % EX CREA: 1.0000 "application " | TOPICAL_CREAM | Freq: Two times a day (BID) | CUTANEOUS | 1 refills | Status: DC

## 2018-09-01 NOTE — Progress Notes (Signed)
   Subjective: Patient presents today for evaluation of intermittent pain to the lateral borders of the bilateral great toes that began several years ago. She states the pain most recently began three weeks ago. Patient is concerned for possible ingrown nail as she gets them recurrently. Wearing shoes increases the pain. She has been clipping the nails out and soaking the toes in Epsom salt. Patient presents today for further treatment and evaluation.  Past Medical History:  Diagnosis Date  . Anxiety   . Asthma   . Irregular heart beat     Objective:  General: Well developed, nourished, in no acute distress, alert and oriented x3   Dermatology: Skin is warm, dry and supple bilateral.  Lateral border of the bilateral great toes appears to be erythematous with evidence of an ingrowing nail. Pain on palpation noted to the border of the nail fold. The remaining nails appear unremarkable at this time. There are no open sores, lesions.  Vascular: Dorsalis Pedis artery and Posterior Tibial artery pedal pulses palpable. No lower extremity edema noted.   Neruologic: Grossly intact via light touch bilateral.  Musculoskeletal: Muscular strength within normal limits in all groups bilateral. Normal range of motion noted to all pedal and ankle joints.   Assesement: #1 Paronychia with ingrowing nail lateral border bilateral great toes #2 Pain in toe #3 Incurvated nail  Plan of Care:  1. Patient evaluated.  2. Discussed treatment alternatives and plan of care. Explained nail avulsion procedure and post procedure course to patient. 3. Patient opted for permanent partial nail avulsion.  4. Prior to procedure, local anesthesia infiltration utilized using 3 ml of a 50:50 mixture of 2% plain lidocaine and 0.5% plain marcaine in a normal hallux block fashion and a betadine prep performed.  5. Partial permanent nail avulsion with chemical matrixectomy performed using [id number] applications of phenol followed  by alcohol flush.  6. Light dressing applied. 7. Return to clinic in 2 weeks.   Edrick Kins, DPM [facility]  Dr. Edrick Kins, [address]                                        [place], [place] [address]                Office [phone]  Fax [phone]

## 2018-09-01 NOTE — Patient Instructions (Signed)

## 2018-09-18 ENCOUNTER — Encounter: Payer: Self-pay | Admitting: Podiatry

## 2018-09-18 ENCOUNTER — Ambulatory Visit (INDEPENDENT_AMBULATORY_CARE_PROVIDER_SITE_OTHER): Payer: BLUE CROSS/BLUE SHIELD | Admitting: Podiatry

## 2018-09-18 DIAGNOSIS — L600 Ingrowing nail: Secondary | ICD-10-CM | POA: Diagnosis not present

## 2018-09-21 NOTE — Progress Notes (Signed)
   Subjective: Patient presents today 2 weeks post ingrown nail permanent nail avulsion procedure of the lateral border of the left hallux. She states she is doing well overall. She reports some continued soreness that is worsened with touch or pressure. She reports some continued associated drainage but states it is mild. She has been using the Gentamicin cream daily as instructed. Patient is here for further evaluation and treatment.   Past Medical History:  Diagnosis Date  . Anxiety   . Asthma   . Irregular heart beat     Objective: Skin is warm, dry and supple. Nail and respective nail fold appears to be healing appropriately. Open wound to the associated nail fold with a granular wound base and moderate amount of fibrotic tissue. Minimal drainage noted. Mild erythema around the periungual region likely due to phenol chemical matricectomy.  Assessment: #1 postop permanent partial nail avulsion lateral border left hallux  #2 open wound periungual nail fold of respective digit.   Plan of care: #1 patient was evaluated  #2 debridement of open wound was performed to the periungual border of the respective toe using a currette. Antibiotic ointment and Band-Aid was applied. #3 Continue using Gentamicin cream daily with a bandage.  #4 patient is to return to clinic in 2 weeks.   Edrick Kins, DPM [facility]  Dr. Edrick Kins, [address]                                        [place], [place] [address]                Office [phone]  Fax [phone]

## 2018-09-25 ENCOUNTER — Ambulatory Visit: Payer: BLUE CROSS/BLUE SHIELD | Admitting: Podiatry

## 2018-10-02 ENCOUNTER — Encounter: Payer: BLUE CROSS/BLUE SHIELD | Admitting: Podiatry

## 2018-10-06 NOTE — Progress Notes (Signed)
This encounter was created in error - please disregard.

## 2018-11-06 ENCOUNTER — Encounter: Payer: Self-pay | Admitting: Podiatry

## 2018-11-06 ENCOUNTER — Ambulatory Visit (INDEPENDENT_AMBULATORY_CARE_PROVIDER_SITE_OTHER): Payer: BLUE CROSS/BLUE SHIELD | Admitting: Podiatry

## 2018-11-06 DIAGNOSIS — L600 Ingrowing nail: Secondary | ICD-10-CM | POA: Diagnosis not present

## 2018-11-06 DIAGNOSIS — L02612 Cutaneous abscess of left foot: Secondary | ICD-10-CM | POA: Diagnosis not present

## 2018-11-06 DIAGNOSIS — L03032 Cellulitis of left toe: Secondary | ICD-10-CM | POA: Diagnosis not present

## 2018-11-06 MED ORDER — GENTAMICIN SULFATE 0.1 % EX CREA: 1.0000 "application " | TOPICAL_CREAM | Freq: Two times a day (BID) | CUTANEOUS | 1 refills | Status: AC

## 2018-11-06 MED ORDER — HYDROCODONE-ACETAMINOPHEN 5-325 MG PO TABS: 1.0000 | ORAL_TABLET | Freq: Three times a day (TID) | ORAL | 0 refills | Status: AC | PRN

## 2018-11-06 MED ORDER — DOXYCYCLINE HYCLATE 100 MG PO TABS: 100.0000 mg | ORAL_TABLET | Freq: Two times a day (BID) | ORAL | 0 refills | Status: AC

## 2018-11-13 NOTE — Progress Notes (Signed)
   HPI: 38 year old female presents the office today for evaluation of a status post partial permanent nail avulsion to the lateral border of the left hallux.  Nail avulsion procedure with chemical matricectomy was performed on 09/01/2018.  Patient states that ever since the procedure she has noticed some tenderness with swelling and redness to the area.  The ingrown toenail did not completely resolve.  She presents today for follow-up treatment and evaluation  Past Medical History:  Diagnosis Date  . Anxiety   . Asthma   . Irregular heart beat      Physical Exam: General: The patient is alert and oriented x3 in no acute distress.  Dermatology: There is some erythema with localized edema noted to the lateral border of the left hallux.  Very tender to palpation.  There is some fluctuance with possible abscess noted within the soft tissues of the lateral border of the left hallux.  Vascular: Palpable pedal pulses bilaterally.  Mild edema and erythema noted lateral border left hallux capillary refill within normal limits.  Neurological: Epicritic and protective threshold grossly intact bilaterally.   Musculoskeletal Exam: Range of motion within normal limits to all pedal and ankle joints bilateral. Muscle strength 5/5 in all groups bilateral.  Pain to palpation to the lateral border of the left hallux  Assessment: 1.  Abscess/ingrown toenail lateral border left hallux   Plan of Care:  1. Patient evaluated.  2.  3 cc 2% lidocaine plain infiltrated in the patient's left hallux and a digital block fashion.  The toe was prepped in aseptic manner and incision and drainage of the localized abscess was performed.  There was some purulent drainage noted.  Dry sterile dressing was applied with post procedural care instructions. 3.  Prescription for doxycycline 100 mg twice daily #20 4.  Continue applying gentamicin cream with a Band-Aid daily 5.  Return to clinic in 2 weeks     Edrick Kins,  DPM [facility]  Dr. Edrick Kins, DPM    [address], [place] [address]                Office [phone]  Fax [phone]

## 2018-11-20 ENCOUNTER — Ambulatory Visit (INDEPENDENT_AMBULATORY_CARE_PROVIDER_SITE_OTHER): Payer: BLUE CROSS/BLUE SHIELD | Admitting: Podiatry

## 2018-11-20 ENCOUNTER — Encounter: Payer: Self-pay | Admitting: Podiatry

## 2018-11-20 DIAGNOSIS — L03032 Cellulitis of left toe: Secondary | ICD-10-CM | POA: Diagnosis not present

## 2018-11-20 DIAGNOSIS — L02612 Cutaneous abscess of left foot: Secondary | ICD-10-CM | POA: Diagnosis not present

## 2018-11-20 DIAGNOSIS — L600 Ingrowing nail: Secondary | ICD-10-CM | POA: Diagnosis not present

## 2018-11-23 NOTE — Progress Notes (Signed)
   Subjective: Patient presents today status post incision and drainage of an abscess noted to the lateral border of the left hallux on 11/06/2018. Patient states that the toe and nail fold is feeling much better. She denies any pain. She has been applying the Gentamicin cream as directed. Patient is here for further evaluation and treatment.   Past Medical History:  Diagnosis Date  . Anxiety   . Asthma   . Irregular heart beat     Objective: Physical Exam General: The patient is alert and oriented x3 in no acute distress.  Dermatology: Skin is cool, dry and supple bilateral lower extremities. Negative for open lesions or macerations.  Vascular: Palpable pedal pulses bilaterally. No edema or erythema noted. Capillary refill within normal limits.  Neurological: Epicritic and protective threshold grossly intact bilaterally.   Musculoskeletal Exam: All pedal and ankle joints range of motion within normal limits bilateral. Muscle strength 5/5 in all groups bilateral.   Assessment: 1. Abscess/ingrown toenail lateral border left hallux   Plan of care: #1 patient was evaluated  #2 light debridement of open wound was performed to the periungual border of the respective toe using a currette. Antibiotic ointment and Band-Aid was applied. #3 Recommended good shoe gear.  #4 patient is to return to clinic on a PRN basis.   Edrick Kins, DPM [facility]  Dr. Edrick Kins, [address]                                        [place], [place] [address]                Office [phone]  Fax [phone]

## 2019-06-10 ENCOUNTER — Other Ambulatory Visit: Payer: Self-pay | Admitting: Certified Nurse Midwife

## 2019-06-10 DIAGNOSIS — Z1231 Encounter for screening mammogram for malignant neoplasm of breast: Secondary | ICD-10-CM

## 2019-08-19 ENCOUNTER — Ambulatory Visit
Admission: RE | Admit: 2019-08-19 | Discharge: 2019-08-19 | Disposition: A | Discharge status: Home / Self Care | Payer: Commercial Managed Care - PPO | Source: Ambulatory Visit | Attending: Certified Nurse Midwife | Admitting: Certified Nurse Midwife

## 2019-08-19 DIAGNOSIS — Z1231 Encounter for screening mammogram for malignant neoplasm of breast: Secondary | ICD-10-CM | POA: Insufficient documentation

## 2019-12-21 ENCOUNTER — Other Ambulatory Visit: Payer: Self-pay | Admitting: Orthopedic Surgery

## 2019-12-21 DIAGNOSIS — M25561 Pain in right knee: Secondary | ICD-10-CM

## 2019-12-21 DIAGNOSIS — M7121 Synovial cyst of popliteal space [Baker], right knee: Secondary | ICD-10-CM

## 2020-01-01 ENCOUNTER — Other Ambulatory Visit: Payer: Self-pay

## 2020-01-01 ENCOUNTER — Ambulatory Visit
Admission: RE | Admit: 2020-01-01 | Discharge: 2020-01-01 | Disposition: A | Discharge status: Home / Self Care | Payer: Commercial Managed Care - PPO | Source: Ambulatory Visit | Attending: Orthopedic Surgery | Admitting: Orthopedic Surgery

## 2020-01-01 DIAGNOSIS — M25561 Pain in right knee: Secondary | ICD-10-CM | POA: Insufficient documentation

## 2020-01-01 DIAGNOSIS — M7121 Synovial cyst of popliteal space [Baker], right knee: Secondary | ICD-10-CM | POA: Insufficient documentation

## 2020-07-05 ENCOUNTER — Other Ambulatory Visit: Payer: Self-pay | Admitting: Internal Medicine

## 2020-07-05 DIAGNOSIS — Z1231 Encounter for screening mammogram for malignant neoplasm of breast: Secondary | ICD-10-CM

## 2020-08-21 ENCOUNTER — Other Ambulatory Visit: Payer: Self-pay

## 2020-08-21 ENCOUNTER — Ambulatory Visit
Admission: RE | Admit: 2020-08-21 | Discharge: 2020-08-21 | Disposition: A | Discharge status: Home / Self Care | Payer: Commercial Managed Care - PPO | Source: Ambulatory Visit | Attending: Internal Medicine | Admitting: Internal Medicine

## 2020-08-21 DIAGNOSIS — Z1231 Encounter for screening mammogram for malignant neoplasm of breast: Secondary | ICD-10-CM | POA: Diagnosis not present

## 2021-08-15 ENCOUNTER — Other Ambulatory Visit: Payer: Self-pay | Admitting: Internal Medicine

## 2021-08-15 DIAGNOSIS — Z1231 Encounter for screening mammogram for malignant neoplasm of breast: Secondary | ICD-10-CM

## 2021-08-23 ENCOUNTER — Other Ambulatory Visit: Payer: Self-pay

## 2021-08-23 ENCOUNTER — Ambulatory Visit
Admission: RE | Admit: 2021-08-23 | Discharge: 2021-08-23 | Disposition: A | Discharge status: Home / Self Care | Payer: Commercial Managed Care - PPO | Source: Ambulatory Visit | Attending: Internal Medicine | Admitting: Internal Medicine

## 2021-08-23 DIAGNOSIS — Z1231 Encounter for screening mammogram for malignant neoplasm of breast: Secondary | ICD-10-CM | POA: Insufficient documentation

## 2021-09-27 ENCOUNTER — Other Ambulatory Visit: Payer: Self-pay | Admitting: Family Medicine

## 2021-09-27 DIAGNOSIS — M5416 Radiculopathy, lumbar region: Secondary | ICD-10-CM

## 2021-10-05 ENCOUNTER — Ambulatory Visit
Admission: RE | Admit: 2021-10-05 | Discharge: 2021-10-05 | Disposition: A | Discharge status: Home / Self Care | Payer: Commercial Managed Care - PPO | Source: Ambulatory Visit | Attending: Family Medicine | Admitting: Family Medicine

## 2021-10-05 DIAGNOSIS — M5416 Radiculopathy, lumbar region: Secondary | ICD-10-CM

## 2022-07-24 ENCOUNTER — Other Ambulatory Visit: Payer: Self-pay

## 2022-07-24 DIAGNOSIS — Z1231 Encounter for screening mammogram for malignant neoplasm of breast: Secondary | ICD-10-CM

## 2022-08-28 ENCOUNTER — Ambulatory Visit
Admission: RE | Admit: 2022-08-28 | Discharge: 2022-08-28 | Disposition: A | Discharge status: Home / Self Care | Payer: Commercial Managed Care - PPO | Source: Ambulatory Visit

## 2022-08-28 DIAGNOSIS — Z1231 Encounter for screening mammogram for malignant neoplasm of breast: Secondary | ICD-10-CM | POA: Diagnosis not present

## 2023-05-05 ENCOUNTER — Other Ambulatory Visit: Payer: Self-pay

## 2023-05-05 DIAGNOSIS — Z1231 Encounter for screening mammogram for malignant neoplasm of breast: Secondary | ICD-10-CM

## 2023-07-03 IMAGING — MR MR LUMBAR SPINE W/O CM
5 series · 35 of 48 positions shown · non-contrast
Comparison: None.

CLINICAL DATA: Low back pain with left leg pain

EXAM:
MRI LUMBAR SPINE WITHOUT CONTRAST
TECHNIQUE: Multiplanar, multisequence MR imaging of the lumbar spine was
performed. No intravenous contrast was administered.

[Series 2: T1 · sagittal · 4.0mm · 0.41mm/px · 6 of 12 slices shown (1 of 2)]
[im 1/12]
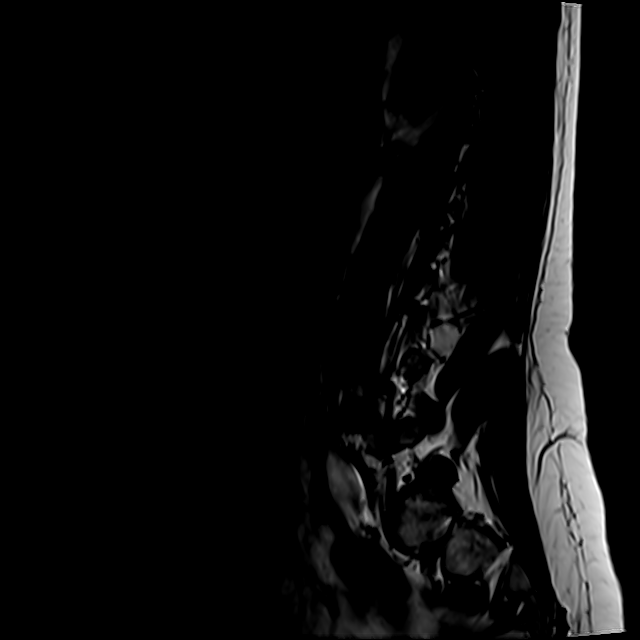
[im 3/12]
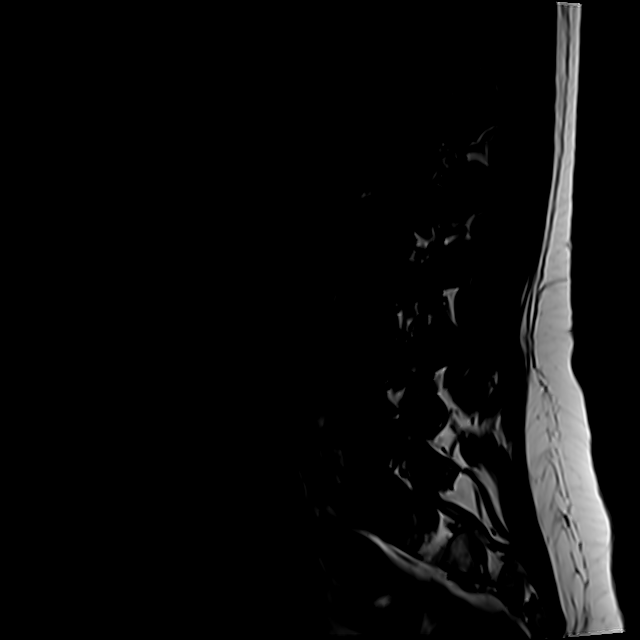
[im 5/12]
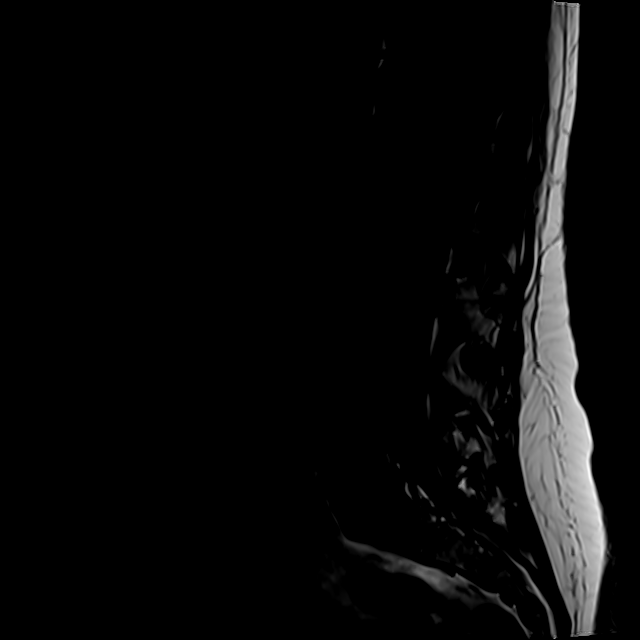
[im 7/12]
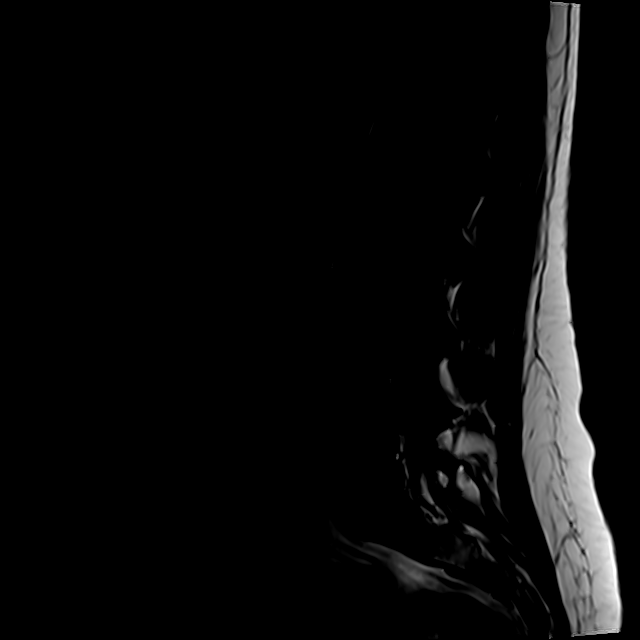
[im 9/12]
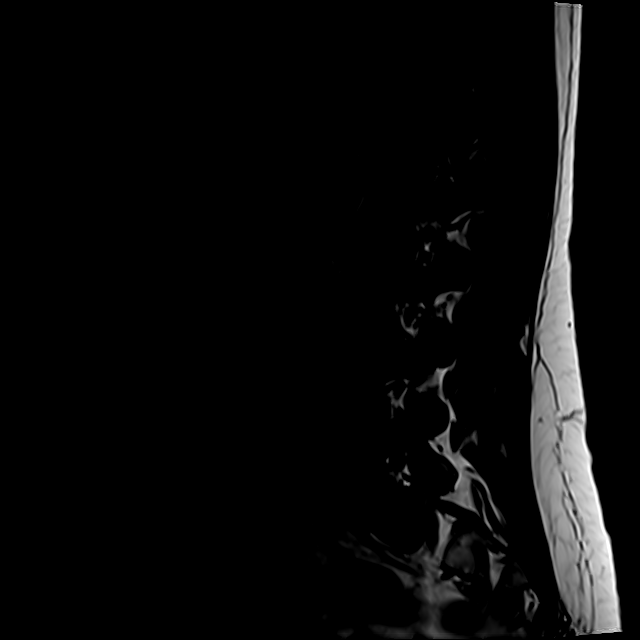
[im 12/12]
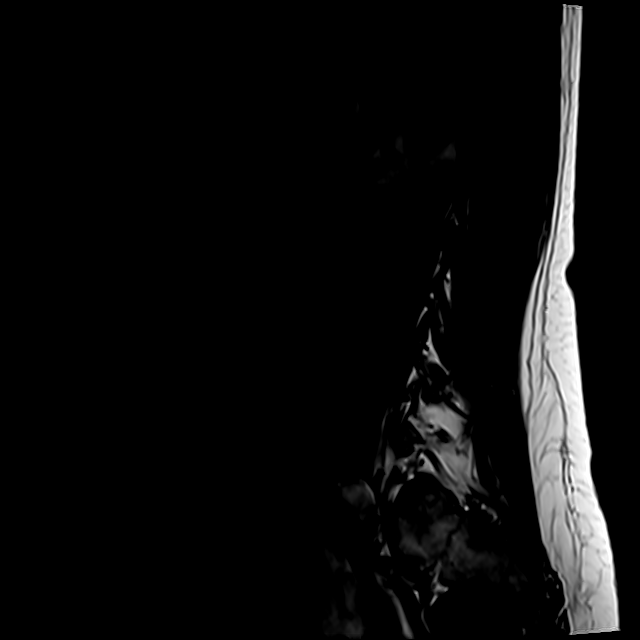

[Series 3: T2 · sagittal · 4.0mm · 1.02mm/px · 5 of 12 slices shown (1 of 2)]
[im 1/12]
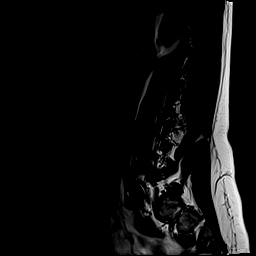
[im 3/12]
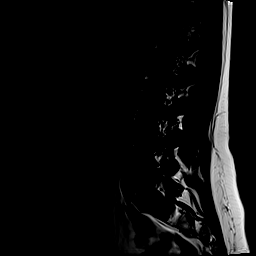
[im 6/12]
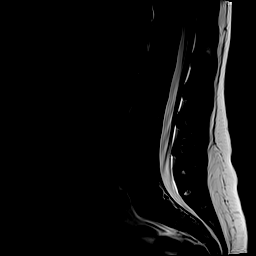
[im 9/12]
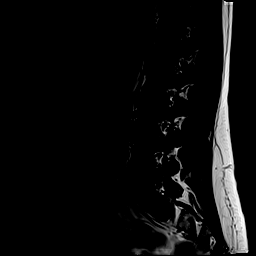
[im 12/12]
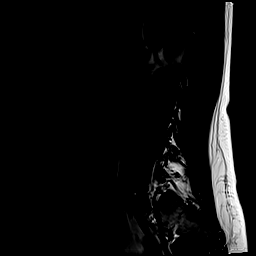

[Series 4: STIR · sagittal · 4.0mm · 0.51mm/px · 4 of 12 slices shown]
[im 1/12]
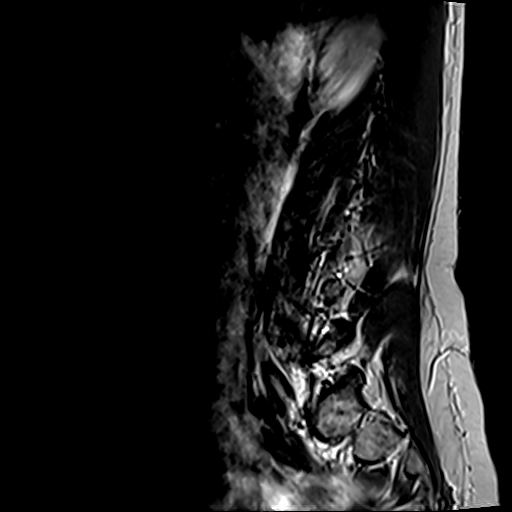
[im 3/12]
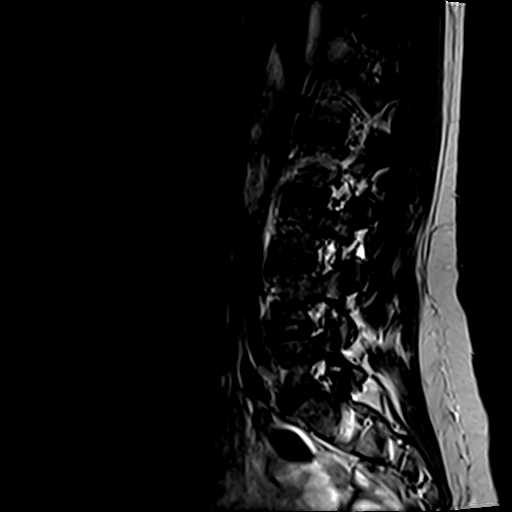
[im 6/12]
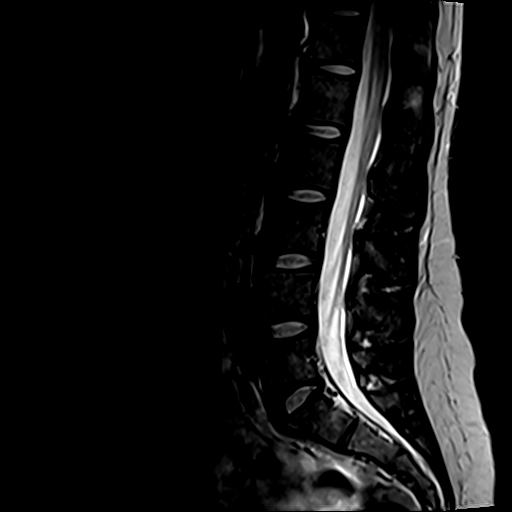
[im 9/12]
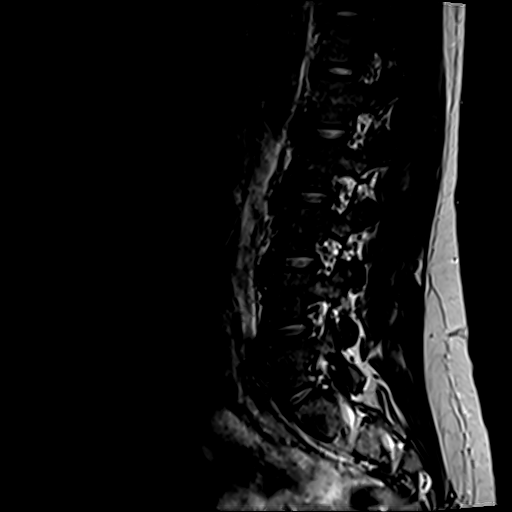

[Series 5: T1 · axial · 4.0mm · 0.78mm/px · z∈[-82,+112]mm · 10 of 39 slices shown (2 of 2)]
[im 3/39]
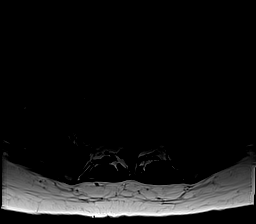
[im 6/39]
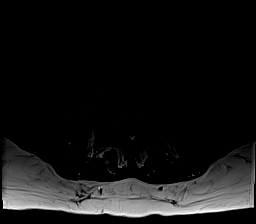
[im 8/39]
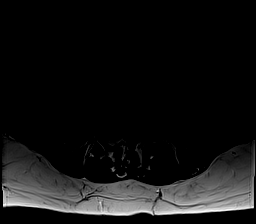
[im 13/39]
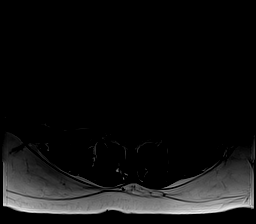
[im 18/39]
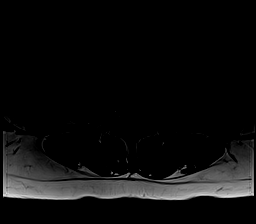
[im 21/39]
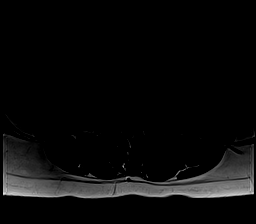
[im 23/39]
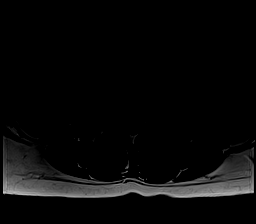
[im 28/39]
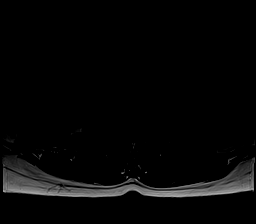
[im 33/39]
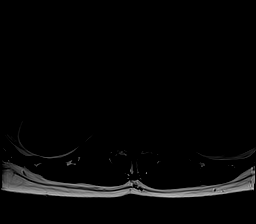
[im 39/39]
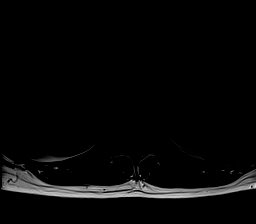

[Series 6: T2 · axial · 4.0mm · 0.78mm/px · z∈[-84,+112]mm · 10 of 39 slices shown (2 of 2)]
[im 3/39]
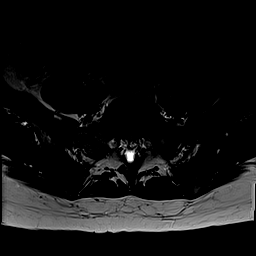
[im 6/39]
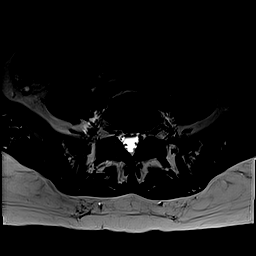
[im 8/39]
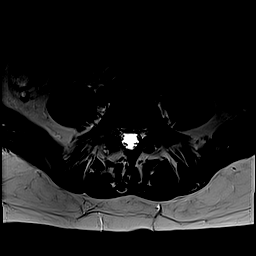
[im 13/39]
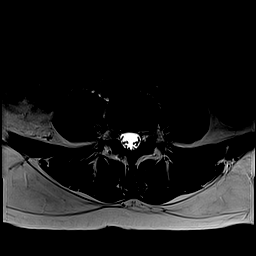
[im 18/39]
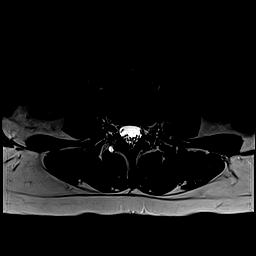
[im 21/39]
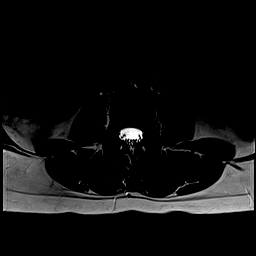
[im 23/39]
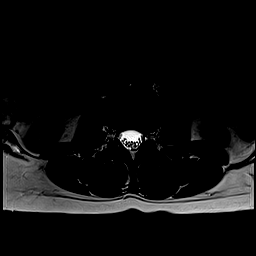
[im 28/39]
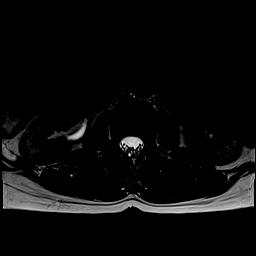
[im 33/39]
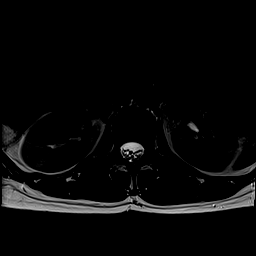
[im 39/39]
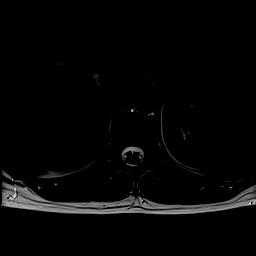

[35 of 48 positions shown; findings below may reference images not displayed]

FINDINGS: Segmentation:  5 lumbar segments

Alignment:  Normal

Vertebrae:  Normal bone marrow.  Negative for fracture or mass

Conus medullaris and cauda equina: Conus extends to the L1 level.
Conus and cauda equina appear normal.

Paraspinal and other soft tissues: Negative for paraspinous mass or
adenopathy.

Disc levels:

Normal disc spaces. No disc degeneration or disc protrusion.
Negative for neural impingement
IMPRESSION: Normal MRI lumbar spine.

## 2023-09-01 ENCOUNTER — Ambulatory Visit
Admission: RE | Admit: 2023-09-01 | Discharge: 2023-09-01 | Disposition: A | Discharge status: Home / Self Care | Payer: Commercial Managed Care - PPO | Source: Ambulatory Visit

## 2023-09-01 DIAGNOSIS — Z1231 Encounter for screening mammogram for malignant neoplasm of breast: Secondary | ICD-10-CM | POA: Insufficient documentation

## 2023-12-31 ENCOUNTER — Ambulatory Visit: Payer: Self-pay

## 2023-12-31 DIAGNOSIS — Z83719 Family history of colon polyps, unspecified: Secondary | ICD-10-CM | POA: Diagnosis not present

## 2023-12-31 DIAGNOSIS — Z1211 Encounter for screening for malignant neoplasm of colon: Secondary | ICD-10-CM | POA: Diagnosis present

## 2023-12-31 DIAGNOSIS — K5730 Diverticulosis of large intestine without perforation or abscess without bleeding: Secondary | ICD-10-CM | POA: Diagnosis not present

## 2024-03-25 ENCOUNTER — Other Ambulatory Visit: Payer: Self-pay

## 2024-03-25 DIAGNOSIS — Z1231 Encounter for screening mammogram for malignant neoplasm of breast: Secondary | ICD-10-CM

## 2024-09-01 ENCOUNTER — Inpatient Hospital Stay: Admission: RE | Admit: 2024-09-01 | Discharge: 2024-09-01

## 2024-09-01 DIAGNOSIS — Z1231 Encounter for screening mammogram for malignant neoplasm of breast: Secondary | ICD-10-CM | POA: Insufficient documentation
